# Patient Record
Sex: Male | Born: 1965 | Race: White | Hispanic: No | Marital: Married | Smoking: Never smoker
Health system: Southern US, Community
[De-identification: ages and names within clinical notes are randomized; demographics above are authoritative.]

## PROBLEM LIST (undated history)

## (undated) DIAGNOSIS — E785 Hyperlipidemia, unspecified: Secondary | ICD-10-CM

## (undated) DIAGNOSIS — T7840XA Allergy, unspecified, initial encounter: Secondary | ICD-10-CM

## (undated) DIAGNOSIS — K219 Gastro-esophageal reflux disease without esophagitis: Secondary | ICD-10-CM

## (undated) HISTORY — DX: Hyperlipidemia, unspecified: E78.5

## (undated) HISTORY — DX: Gastro-esophageal reflux disease without esophagitis: K21.9

## (undated) HISTORY — DX: Allergy, unspecified, initial encounter: T78.40XA

---
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
## 2000-12-24 ENCOUNTER — Encounter: Payer: Self-pay | Admitting: Family Medicine

## 2000-12-24 ENCOUNTER — Encounter: Admission: RE | Admit: 2000-12-24 | Discharge: 2000-12-24 | Payer: Self-pay | Admitting: Family Medicine

## 2004-12-28 ENCOUNTER — Emergency Department (HOSPITAL_COMMUNITY): Admission: EM | Admit: 2004-12-28 | Discharge: 2004-12-28 | Payer: Self-pay | Admitting: Emergency Medicine

## 2007-06-27 ENCOUNTER — Encounter: Admission: RE | Admit: 2007-06-27 | Discharge: 2007-06-27 | Payer: Self-pay | Admitting: Internal Medicine

## 2008-03-11 ENCOUNTER — Emergency Department (HOSPITAL_BASED_OUTPATIENT_CLINIC_OR_DEPARTMENT_OTHER): Admission: EM | Admit: 2008-03-11 | Discharge: 2008-03-11 | Payer: Self-pay | Admitting: Emergency Medicine

## 2008-07-21 ENCOUNTER — Encounter: Admission: RE | Admit: 2008-07-21 | Discharge: 2008-07-21 | Payer: Self-pay | Admitting: Internal Medicine

## 2008-08-21 ENCOUNTER — Encounter: Admission: RE | Admit: 2008-08-21 | Discharge: 2008-08-21 | Payer: Self-pay | Admitting: Neurosurgery

## 2011-02-24 LAB — DIFFERENTIAL
Basophils Absolute: 0
Basophils Relative: 1
Eosinophils Absolute: 0.1
Eosinophils Relative: 1
Monocytes Absolute: 0.4
Monocytes Relative: 7
Neutro Abs: 4.1

## 2011-02-24 LAB — CBC
HCT: 45.7
Hemoglobin: 15.5
MCHC: 34
MCV: 93.4
RDW: 12.2

## 2011-02-24 LAB — BASIC METABOLIC PANEL
CO2: 29
Calcium: 10
Chloride: 103
Glucose, Bld: 98
Sodium: 143

## 2011-02-24 LAB — POCT CARDIAC MARKERS: Troponin i, poc: <0.05

## 2014-06-28 ENCOUNTER — Ambulatory Visit (AMBULATORY_SURGERY_CENTER): Payer: Self-pay | Admitting: *Deleted

## 2014-06-28 VITALS — Ht 75.0 in | Wt 260.8 lb

## 2014-06-28 DIAGNOSIS — Z1211 Encounter for screening for malignant neoplasm of colon: Secondary | ICD-10-CM

## 2014-06-28 MED ORDER — MOVIPREP 100 G PO SOLR: 1.0000 | Freq: Once | ORAL | Status: DC

## 2014-06-28 NOTE — Progress Notes (Signed)
No home 02 use No diet pills No egg or soy allergy No issues with past sedation but was hard to wake with arthroscopy with shoulder  emmi video to e mail

## 2014-07-10 ENCOUNTER — Encounter: Payer: Self-pay | Admitting: Gastroenterology

## 2014-07-10 ENCOUNTER — Ambulatory Visit (AMBULATORY_SURGERY_CENTER): Payer: BLUE CROSS/BLUE SHIELD | Admitting: Gastroenterology

## 2014-07-10 VITALS — BP 119/73 | HR 57 | Temp 96.5°F | Resp 15 | Ht 75.0 in | Wt 260.0 lb

## 2014-07-10 DIAGNOSIS — Z1211 Encounter for screening for malignant neoplasm of colon: Secondary | ICD-10-CM

## 2014-07-10 MED ORDER — SODIUM CHLORIDE 0.9 % IV SOLN: 500.0000 mL | INTRAVENOUS | Status: DC

## 2014-07-10 NOTE — Progress Notes (Signed)
  [facility] Anesthesia Post-op Note  Patient: Karl DropRobert T Claassen  Procedure(s) Performed: colonoscopy  Patient Location: [facility] - Recovery Area  Anesthesia Type: Deep Sedation/Propofol  Level of Consciousness: awake, oriented and patient cooperative  Airway and Oxygen Therapy: Patient Spontanous Breathing  Post-op Pain: none  Post-op Assessment:  Post-op Vital signs reviewed, Patient's Cardiovascular Status Stable, Respiratory Function Stable, Patent Airway, No signs of Nausea or vomiting and Pain level controlled  Post-op Vital Signs: Reviewed and stable  Complications: No apparent anesthesia complications  Thora Scherman E 9:45 AM

## 2014-07-10 NOTE — Patient Instructions (Signed)

## 2014-07-10 NOTE — Op Note (Signed)
[facility] [address] [place], [id number]   COLONOSCOPY PROCEDURE REPORT  PATIENT: Karl Mccullough, Karl Mccullough  MR#: [id number] BIRTHDATE: [birth year] , 48  yrs. old GENDER: male ENDOSCOPIST: Meryl DareMalcolm Mccullough Griffon Herberg, MD, [facility] REFERRED ZH:[id number]:Russo, John PROCEDURE DATE:  07/10/2014 PROCEDURE:   Colonoscopy, screening First Screening Colonoscopy - Avg.  risk and is 50 yrs.  old or older Yes.  Prior Negative Screening - Now for repeat screening. N/A  History of Adenoma - Now for follow-up colonoscopy & has been > or = to 3 yrs.  N/A  Polyps Removed Today? No.  Polyps Removed Today? No.  Recommend repeat exam, <10 yrs? Polyps Removed Today? No.  Recommend repeat exam, <10 yrs? No. ASA CLASS:   Class II INDICATIONS:average risk patient for colorectal cancer. MEDICATIONS: Monitored anesthesia care and Propofol 200 mg IV DESCRIPTION OF PROCEDURE:   After the risks benefits and alternatives of the procedure were thoroughly explained, informed consent was obtained.  The digital rectal exam revealed no abnormalities of the rectum.   The     endoscope was introduced through the anus and advanced to the cecum, which was identified by both the appendix and ileocecal valve. No adverse events experienced.   The quality of the prep was excellent, using MoviPrep  The instrument was then slowly withdrawn as the colon was fully examined.    COLON FINDINGS: There was mild diverticulosis noted in the sigmoid colon and transverse colon.   The examination was otherwise normal. Retroflexed views revealed internal Grade I hemorrhoids. The time to cecum=2 minutes 32 seconds.  Withdrawal time=9 minutes 56 seconds.  The scope was withdrawn and the procedure completed. COMPLICATIONS: There were no immediate complications.  ENDOSCOPIC IMPRESSION: 1.   Mild diverticulosis in the sigmoid colon and transverse colon 2.   Grade l internal hemorrhoids  RECOMMENDATIONS: 1.  High fiber diet with liberal fluid  intake. 2.  You should continue to follow colorectal cancer screening guidelines for "routine risk" patients with a repeat colonoscopy in 10 years.  There is no need for routine, screening FOBT (stool) testing for at least 5 years.  eSigned:  Meryl DareMalcolm Mccullough Lason Eveland, MD, [facility] 07/10/2014 9:46 AM

## 2014-07-11 ENCOUNTER — Telehealth: Payer: Self-pay | Admitting: *Deleted

## 2014-07-11 NOTE — Telephone Encounter (Signed)
  Follow up Call-  Call back number 07/10/2014  Post procedure Call Back phone  # [phone]  Permission to leave phone message Yes     Patient questions:  Do you have a fever, pain , or abdominal swelling? No. Pain Score  0 *  Have you tolerated food without any problems? Yes.    Have you been able to return to your normal activities? Yes.    Do you have any questions about your discharge instructions: Diet   No. Medications  No. Follow up visit  No.  Do you have questions or concerns about your Care? No.  Actions: * If pain score is 4 or above: No action needed, pain <4.

## 2015-09-06 DIAGNOSIS — H40002 Preglaucoma, unspecified, left eye: Secondary | ICD-10-CM | POA: Diagnosis not present

## 2015-09-06 DIAGNOSIS — H21232 Degeneration of iris (pigmentary), left eye: Secondary | ICD-10-CM | POA: Diagnosis not present

## 2015-09-12 DIAGNOSIS — M9903 Segmental and somatic dysfunction of lumbar region: Secondary | ICD-10-CM | POA: Diagnosis not present

## 2015-09-12 DIAGNOSIS — M5417 Radiculopathy, lumbosacral region: Secondary | ICD-10-CM | POA: Diagnosis not present

## 2015-09-12 DIAGNOSIS — M9902 Segmental and somatic dysfunction of thoracic region: Secondary | ICD-10-CM | POA: Diagnosis not present

## 2015-09-12 DIAGNOSIS — M546 Pain in thoracic spine: Secondary | ICD-10-CM | POA: Diagnosis not present

## 2015-12-06 DIAGNOSIS — E784 Other hyperlipidemia: Secondary | ICD-10-CM | POA: Diagnosis not present

## 2015-12-16 DIAGNOSIS — E669 Obesity, unspecified: Secondary | ICD-10-CM | POA: Diagnosis not present

## 2015-12-16 DIAGNOSIS — M549 Dorsalgia, unspecified: Secondary | ICD-10-CM | POA: Diagnosis not present

## 2015-12-16 DIAGNOSIS — J019 Acute sinusitis, unspecified: Secondary | ICD-10-CM | POA: Diagnosis not present

## 2015-12-16 DIAGNOSIS — E784 Other hyperlipidemia: Secondary | ICD-10-CM | POA: Diagnosis not present

## 2016-03-05 DIAGNOSIS — Z23 Encounter for immunization: Secondary | ICD-10-CM | POA: Diagnosis not present

## 2016-03-10 DIAGNOSIS — M25512 Pain in left shoulder: Secondary | ICD-10-CM | POA: Diagnosis not present

## 2016-03-18 DIAGNOSIS — M7542 Impingement syndrome of left shoulder: Secondary | ICD-10-CM | POA: Diagnosis not present

## 2016-04-15 DIAGNOSIS — M7542 Impingement syndrome of left shoulder: Secondary | ICD-10-CM | POA: Diagnosis not present

## 2016-04-25 DIAGNOSIS — M7542 Impingement syndrome of left shoulder: Secondary | ICD-10-CM | POA: Diagnosis not present

## 2016-05-04 DIAGNOSIS — M7542 Impingement syndrome of left shoulder: Secondary | ICD-10-CM | POA: Diagnosis not present

## 2016-05-04 DIAGNOSIS — M7502 Adhesive capsulitis of left shoulder: Secondary | ICD-10-CM | POA: Diagnosis not present

## 2016-05-26 DIAGNOSIS — M7542 Impingement syndrome of left shoulder: Secondary | ICD-10-CM | POA: Diagnosis not present

## 2016-05-26 DIAGNOSIS — R600 Localized edema: Secondary | ICD-10-CM | POA: Diagnosis not present

## 2016-05-26 DIAGNOSIS — M7502 Adhesive capsulitis of left shoulder: Secondary | ICD-10-CM | POA: Diagnosis not present

## 2016-05-26 DIAGNOSIS — M75112 Incomplete rotator cuff tear or rupture of left shoulder, not specified as traumatic: Secondary | ICD-10-CM | POA: Diagnosis not present

## 2016-05-26 DIAGNOSIS — S43432A Superior glenoid labrum lesion of left shoulder, initial encounter: Secondary | ICD-10-CM | POA: Diagnosis not present

## 2016-05-26 DIAGNOSIS — G8918 Other acute postprocedural pain: Secondary | ICD-10-CM | POA: Diagnosis not present

## 2016-05-26 DIAGNOSIS — M24112 Other articular cartilage disorders, left shoulder: Secondary | ICD-10-CM | POA: Diagnosis not present

## 2016-05-26 DIAGNOSIS — M19012 Primary osteoarthritis, left shoulder: Secondary | ICD-10-CM | POA: Diagnosis not present

## 2016-05-26 DIAGNOSIS — M659 Synovitis and tenosynovitis, unspecified: Secondary | ICD-10-CM | POA: Diagnosis not present

## 2016-05-27 DIAGNOSIS — M25512 Pain in left shoulder: Secondary | ICD-10-CM | POA: Diagnosis not present

## 2016-05-29 DIAGNOSIS — M25512 Pain in left shoulder: Secondary | ICD-10-CM | POA: Diagnosis not present

## 2016-06-01 DIAGNOSIS — M25512 Pain in left shoulder: Secondary | ICD-10-CM | POA: Diagnosis not present

## 2016-06-03 DIAGNOSIS — M25512 Pain in left shoulder: Secondary | ICD-10-CM | POA: Diagnosis not present

## 2016-06-05 DIAGNOSIS — M25512 Pain in left shoulder: Secondary | ICD-10-CM | POA: Diagnosis not present

## 2016-06-08 DIAGNOSIS — M25512 Pain in left shoulder: Secondary | ICD-10-CM | POA: Diagnosis not present

## 2016-06-08 DIAGNOSIS — Z0000 Encounter for general adult medical examination without abnormal findings: Secondary | ICD-10-CM | POA: Diagnosis not present

## 2016-06-09 DIAGNOSIS — J0100 Acute maxillary sinusitis, unspecified: Secondary | ICD-10-CM | POA: Diagnosis not present

## 2016-06-12 DIAGNOSIS — M25512 Pain in left shoulder: Secondary | ICD-10-CM | POA: Diagnosis not present

## 2016-06-15 DIAGNOSIS — M25512 Pain in left shoulder: Secondary | ICD-10-CM | POA: Diagnosis not present

## 2016-06-15 DIAGNOSIS — Z125 Encounter for screening for malignant neoplasm of prostate: Secondary | ICD-10-CM | POA: Diagnosis not present

## 2016-06-15 DIAGNOSIS — J302 Other seasonal allergic rhinitis: Secondary | ICD-10-CM | POA: Diagnosis not present

## 2016-06-15 DIAGNOSIS — Z1389 Encounter for screening for other disorder: Secondary | ICD-10-CM | POA: Diagnosis not present

## 2016-06-15 DIAGNOSIS — Z0000 Encounter for general adult medical examination without abnormal findings: Secondary | ICD-10-CM | POA: Diagnosis not present

## 2016-06-15 DIAGNOSIS — K219 Gastro-esophageal reflux disease without esophagitis: Secondary | ICD-10-CM | POA: Diagnosis not present

## 2016-06-15 DIAGNOSIS — E668 Other obesity: Secondary | ICD-10-CM | POA: Diagnosis not present

## 2016-06-22 DIAGNOSIS — M25512 Pain in left shoulder: Secondary | ICD-10-CM | POA: Diagnosis not present

## 2016-06-26 DIAGNOSIS — M25512 Pain in left shoulder: Secondary | ICD-10-CM | POA: Diagnosis not present

## 2016-06-29 DIAGNOSIS — M25512 Pain in left shoulder: Secondary | ICD-10-CM | POA: Diagnosis not present

## 2016-07-03 DIAGNOSIS — M25512 Pain in left shoulder: Secondary | ICD-10-CM | POA: Diagnosis not present

## 2016-07-14 DIAGNOSIS — Z1212 Encounter for screening for malignant neoplasm of rectum: Secondary | ICD-10-CM | POA: Diagnosis not present

## 2016-07-16 DIAGNOSIS — R69 Illness, unspecified: Secondary | ICD-10-CM | POA: Diagnosis not present

## 2016-07-16 DIAGNOSIS — R52 Pain, unspecified: Secondary | ICD-10-CM | POA: Diagnosis not present

## 2016-11-17 DIAGNOSIS — M545 Low back pain: Secondary | ICD-10-CM | POA: Diagnosis not present

## 2016-11-17 DIAGNOSIS — M542 Cervicalgia: Secondary | ICD-10-CM | POA: Diagnosis not present

## 2016-11-17 DIAGNOSIS — M9903 Segmental and somatic dysfunction of lumbar region: Secondary | ICD-10-CM | POA: Diagnosis not present

## 2016-11-17 DIAGNOSIS — M9901 Segmental and somatic dysfunction of cervical region: Secondary | ICD-10-CM | POA: Diagnosis not present

## 2016-12-25 DIAGNOSIS — R030 Elevated blood-pressure reading, without diagnosis of hypertension: Secondary | ICD-10-CM | POA: Diagnosis not present

## 2016-12-25 DIAGNOSIS — E668 Other obesity: Secondary | ICD-10-CM | POA: Diagnosis not present

## 2016-12-25 DIAGNOSIS — M25512 Pain in left shoulder: Secondary | ICD-10-CM | POA: Diagnosis not present

## 2016-12-25 DIAGNOSIS — E784 Other hyperlipidemia: Secondary | ICD-10-CM | POA: Diagnosis not present

## 2017-03-06 DIAGNOSIS — Z23 Encounter for immunization: Secondary | ICD-10-CM | POA: Diagnosis not present

## 2017-05-10 DIAGNOSIS — J040 Acute laryngitis: Secondary | ICD-10-CM | POA: Diagnosis not present

## 2017-05-10 DIAGNOSIS — J019 Acute sinusitis, unspecified: Secondary | ICD-10-CM | POA: Diagnosis not present

## 2017-06-11 DIAGNOSIS — R82998 Other abnormal findings in urine: Secondary | ICD-10-CM | POA: Diagnosis not present

## 2017-06-11 DIAGNOSIS — Z125 Encounter for screening for malignant neoplasm of prostate: Secondary | ICD-10-CM | POA: Diagnosis not present

## 2017-06-11 DIAGNOSIS — Z0000 Encounter for general adult medical examination without abnormal findings: Secondary | ICD-10-CM | POA: Diagnosis not present

## 2017-06-11 DIAGNOSIS — E7849 Other hyperlipidemia: Secondary | ICD-10-CM | POA: Diagnosis not present

## 2017-06-18 DIAGNOSIS — R0683 Snoring: Secondary | ICD-10-CM | POA: Diagnosis not present

## 2017-06-18 DIAGNOSIS — R030 Elevated blood-pressure reading, without diagnosis of hypertension: Secondary | ICD-10-CM | POA: Diagnosis not present

## 2017-06-18 DIAGNOSIS — E668 Other obesity: Secondary | ICD-10-CM | POA: Diagnosis not present

## 2017-06-18 DIAGNOSIS — Z1389 Encounter for screening for other disorder: Secondary | ICD-10-CM | POA: Diagnosis not present

## 2017-06-18 DIAGNOSIS — M25512 Pain in left shoulder: Secondary | ICD-10-CM | POA: Diagnosis not present

## 2017-06-18 DIAGNOSIS — Z0000 Encounter for general adult medical examination without abnormal findings: Secondary | ICD-10-CM | POA: Diagnosis not present

## 2017-06-21 ENCOUNTER — Other Ambulatory Visit: Payer: Self-pay | Admitting: Internal Medicine

## 2017-06-21 DIAGNOSIS — E785 Hyperlipidemia, unspecified: Secondary | ICD-10-CM

## 2017-06-22 DIAGNOSIS — Z1212 Encounter for screening for malignant neoplasm of rectum: Secondary | ICD-10-CM | POA: Diagnosis not present

## 2017-06-29 ENCOUNTER — Ambulatory Visit
Admission: RE | Admit: 2017-06-29 | Discharge: 2017-06-29 | Disposition: A | Discharge status: Home / Self Care | Payer: BLUE CROSS/BLUE SHIELD | Source: Ambulatory Visit | Attending: Internal Medicine | Admitting: Internal Medicine

## 2017-06-29 DIAGNOSIS — E785 Hyperlipidemia, unspecified: Secondary | ICD-10-CM

## 2017-12-21 DIAGNOSIS — E7849 Other hyperlipidemia: Secondary | ICD-10-CM | POA: Diagnosis not present

## 2018-02-24 DIAGNOSIS — Z23 Encounter for immunization: Secondary | ICD-10-CM | POA: Diagnosis not present

## 2018-06-02 IMAGING — CT CT HEART SCORING
3 series · 14 of 20 positions shown, 16 images · non-contrast
Comparison: None.

CLINICAL DATA: High cholesterol

EXAM:
CT HEART FOR CALCIUM SCORING
TECHNIQUE: CT heart was performed on a 64 channel system using prospective ECG
gating.
A non-contrast exam for calcium scoring was performed.
Note that this exam targets the heart and the chest was not imaged
in its entirety.

[Series 2: smartscore - gated 0.4 sec · axial · 0.49mm/px · z∈[-252,-162]mm · 3 of 72 slices shown]
[im 18/72  vessel]
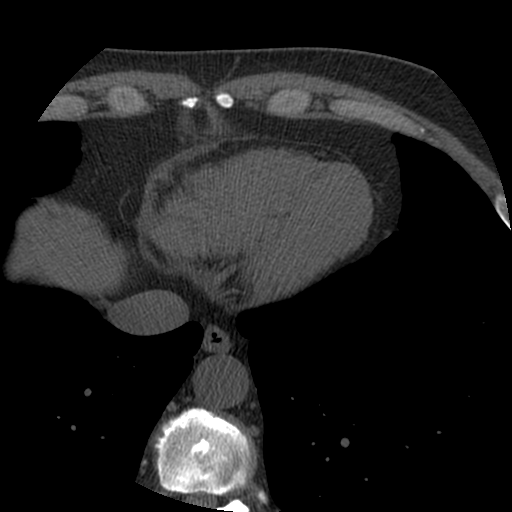
[im 36/72  vessel]
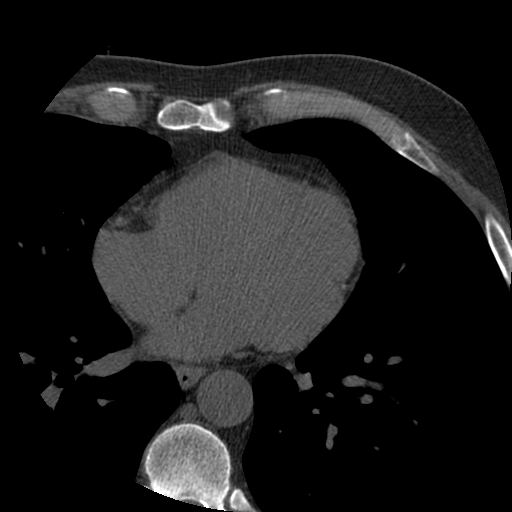
[im 54/72  vessel]
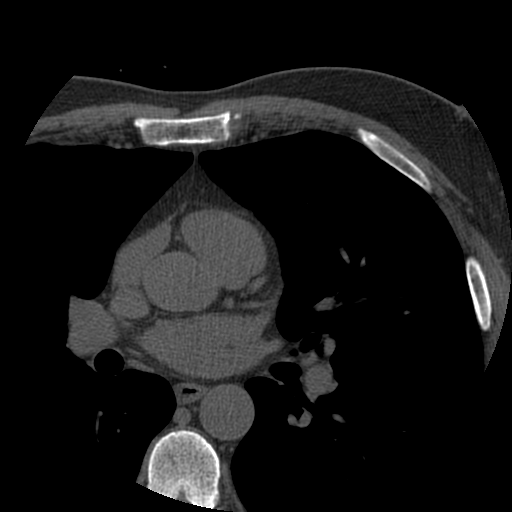

[Series 3: standard 5mm · axial · 0.70mm/px · z∈[-259,-154]mm · 4 of 72 slices shown]
[im 15/72  vessel]
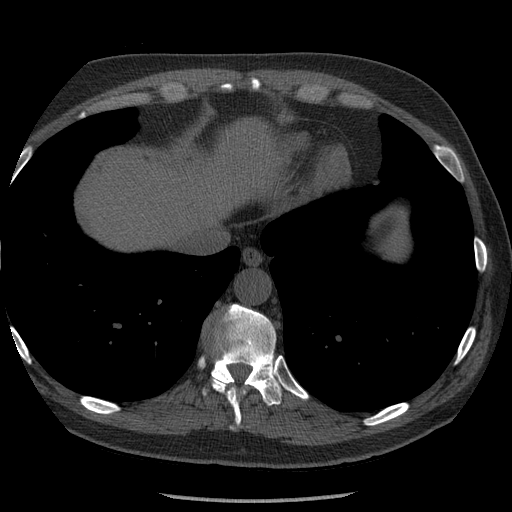
[im 29/72  vessel]
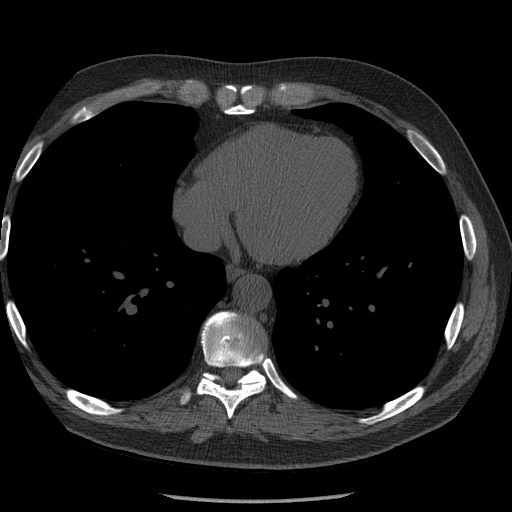
[im 43/72  vessel]
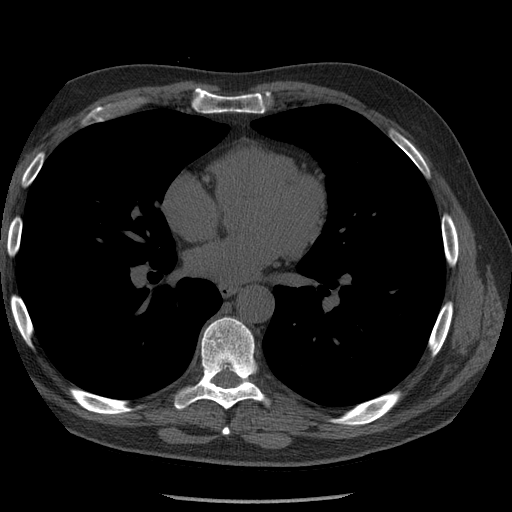
[im 57/72  vessel]
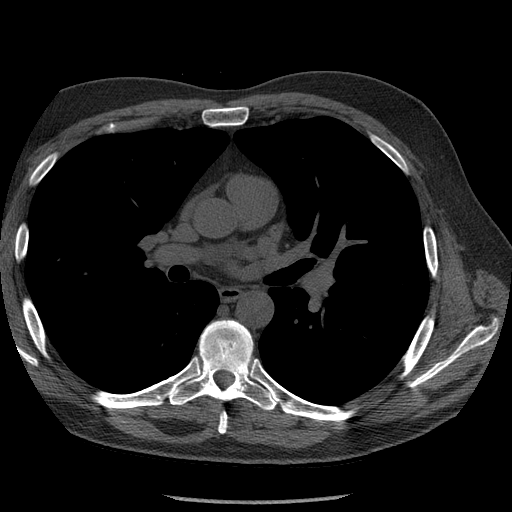

[Series 501: sagittal · sagittal · 0.70mm/px · 7 of 117 slices shown, 9 images]
[im 15/117  vessel]
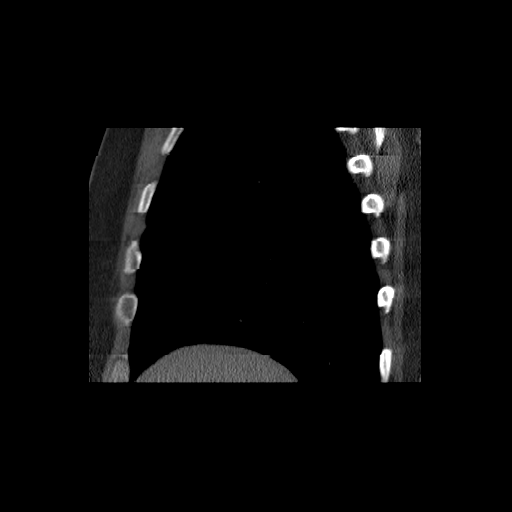
[im 15/117  lung]
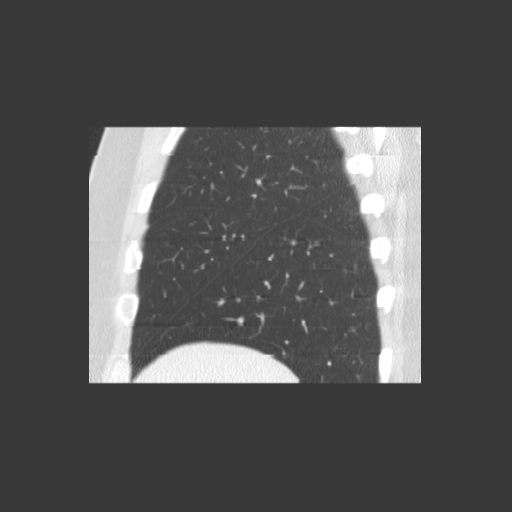
[im 30/117  vessel]
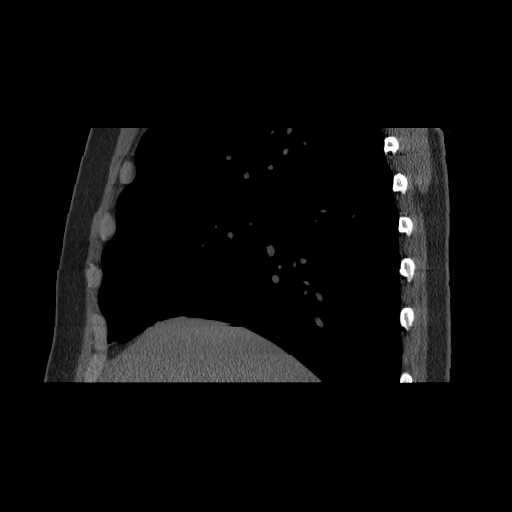
[im 44/117  vessel]
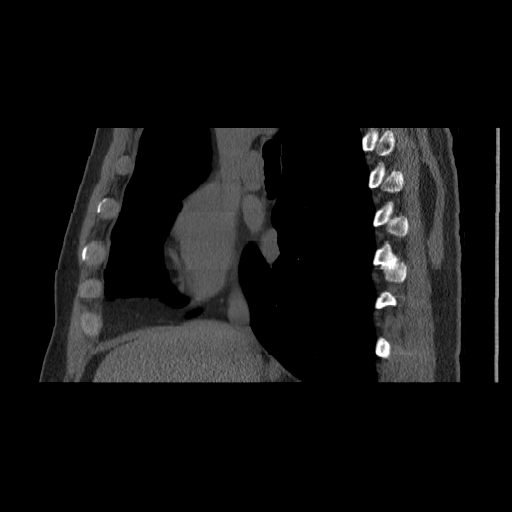
[im 59/117  vessel]
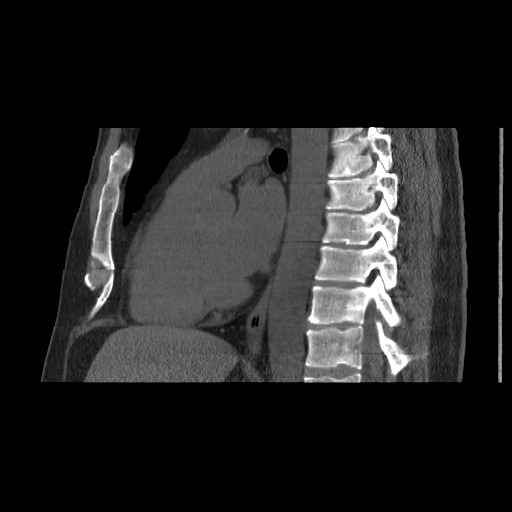
[im 73/117  vessel]
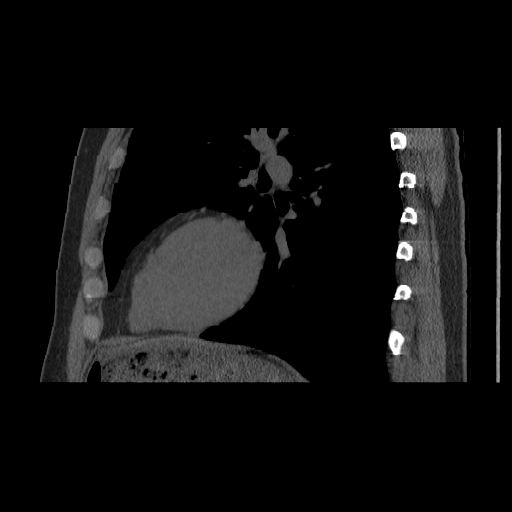
[im 73/117  lung]
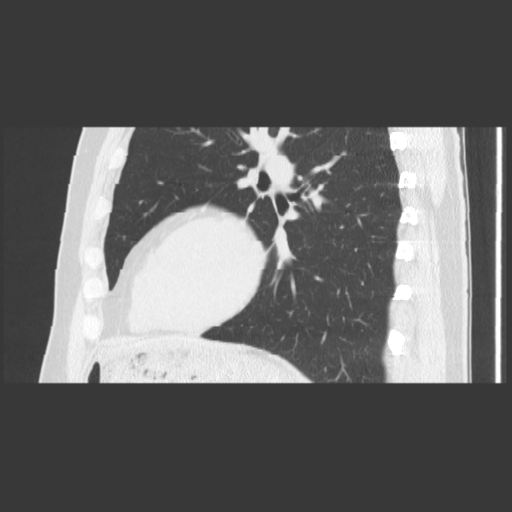
[im 88/117  vessel]
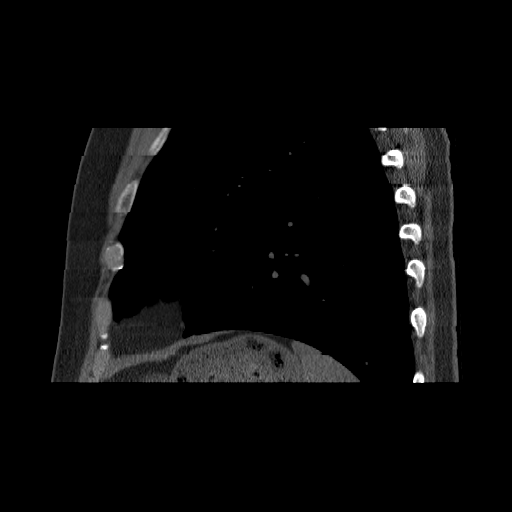
[im 102/117  vessel]
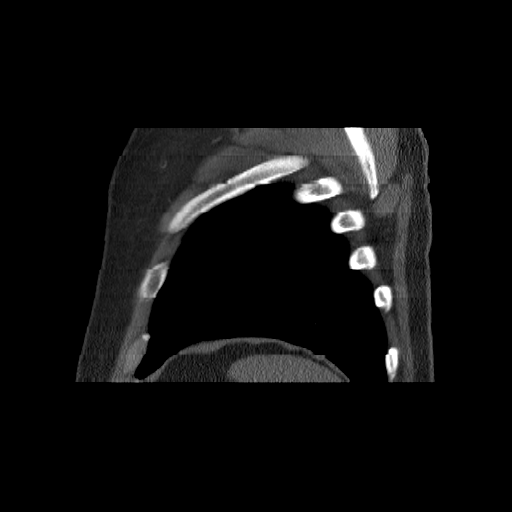

[14 of 20 positions shown; findings below may reference images not displayed]

FINDINGS: Technical quality: Good.

CORONARY CALCIUM

Total Agatston Score: 0

[HOSPITAL] percentile:  0

OTHER FINDINGS:

Cardiovascular: Heart is normal size. Visualized aorta is normal
caliber.

Mediastinum/Nodes: No adenopathy in the lower mediastinum or hila.

Lungs/Pleura: Small nodule in the left lower lobe on image 43, 4 mm.
3 mm nodule in the right upper lobe on image 6. 3 mm nodule in the
right middle lobe on image 27. No effusions.

Upper Abdomen: Imaging into the upper abdomen shows no acute
findings.

Musculoskeletal: Chest wall soft tissues are unremarkable. No acute
bony abnormality.
IMPRESSION: No visible coronary artery calcifications. Total coronary calcium
score of 0.

Small bilateral pulmonary nodules, 4 mm or smaller. No follow-up
needed if patient is low-risk (and has no known or suspected primary
neoplasm). Non-contrast chest CT can be considered in 12 months if
patient is high-risk. This recommendation follows the consensus
statement: Guidelines for Management of Incidental Pulmonary Nodules
Detected on CT Images: From the [HOSPITAL] 0821; Radiology

## 2018-06-16 DIAGNOSIS — R509 Fever, unspecified: Secondary | ICD-10-CM | POA: Diagnosis not present

## 2018-06-21 DIAGNOSIS — Z0000 Encounter for general adult medical examination without abnormal findings: Secondary | ICD-10-CM | POA: Diagnosis not present

## 2018-06-21 DIAGNOSIS — R82998 Other abnormal findings in urine: Secondary | ICD-10-CM | POA: Diagnosis not present

## 2018-06-21 DIAGNOSIS — E7849 Other hyperlipidemia: Secondary | ICD-10-CM | POA: Diagnosis not present

## 2018-06-21 DIAGNOSIS — Z125 Encounter for screening for malignant neoplasm of prostate: Secondary | ICD-10-CM | POA: Diagnosis not present

## 2018-06-24 DIAGNOSIS — E7849 Other hyperlipidemia: Secondary | ICD-10-CM | POA: Diagnosis not present

## 2018-06-24 DIAGNOSIS — Z1339 Encounter for screening examination for other mental health and behavioral disorders: Secondary | ICD-10-CM | POA: Diagnosis not present

## 2018-06-24 DIAGNOSIS — Z1331 Encounter for screening for depression: Secondary | ICD-10-CM | POA: Diagnosis not present

## 2018-06-24 DIAGNOSIS — Z0000 Encounter for general adult medical examination without abnormal findings: Secondary | ICD-10-CM | POA: Diagnosis not present

## 2018-06-24 DIAGNOSIS — Z23 Encounter for immunization: Secondary | ICD-10-CM | POA: Diagnosis not present

## 2018-06-24 DIAGNOSIS — R0683 Snoring: Secondary | ICD-10-CM | POA: Diagnosis not present

## 2018-06-24 DIAGNOSIS — R911 Solitary pulmonary nodule: Secondary | ICD-10-CM | POA: Diagnosis not present

## 2018-06-24 DIAGNOSIS — Z8249 Family history of ischemic heart disease and other diseases of the circulatory system: Secondary | ICD-10-CM | POA: Diagnosis not present

## 2018-06-27 DIAGNOSIS — Z1212 Encounter for screening for malignant neoplasm of rectum: Secondary | ICD-10-CM | POA: Diagnosis not present

## 2018-06-29 ENCOUNTER — Other Ambulatory Visit: Payer: Self-pay | Admitting: Internal Medicine

## 2018-06-29 DIAGNOSIS — R911 Solitary pulmonary nodule: Secondary | ICD-10-CM

## 2018-07-04 ENCOUNTER — Ambulatory Visit
Admission: RE | Admit: 2018-07-04 | Discharge: 2018-07-04 | Disposition: A | Discharge status: Home / Self Care | Payer: BLUE CROSS/BLUE SHIELD | Source: Ambulatory Visit | Attending: Internal Medicine | Admitting: Internal Medicine

## 2018-07-04 DIAGNOSIS — R911 Solitary pulmonary nodule: Secondary | ICD-10-CM

## 2018-07-04 DIAGNOSIS — R918 Other nonspecific abnormal finding of lung field: Secondary | ICD-10-CM | POA: Diagnosis not present

## 2018-09-28 DIAGNOSIS — M25562 Pain in left knee: Secondary | ICD-10-CM | POA: Diagnosis not present

## 2019-02-28 DIAGNOSIS — Z23 Encounter for immunization: Secondary | ICD-10-CM | POA: Diagnosis not present

## 2019-03-24 DIAGNOSIS — M545 Low back pain: Secondary | ICD-10-CM | POA: Diagnosis not present

## 2019-03-24 DIAGNOSIS — M9902 Segmental and somatic dysfunction of thoracic region: Secondary | ICD-10-CM | POA: Diagnosis not present

## 2019-03-24 DIAGNOSIS — M9903 Segmental and somatic dysfunction of lumbar region: Secondary | ICD-10-CM | POA: Diagnosis not present

## 2019-03-24 DIAGNOSIS — M5413 Radiculopathy, cervicothoracic region: Secondary | ICD-10-CM | POA: Diagnosis not present

## 2019-05-12 DIAGNOSIS — M5413 Radiculopathy, cervicothoracic region: Secondary | ICD-10-CM | POA: Diagnosis not present

## 2019-05-12 DIAGNOSIS — M545 Low back pain: Secondary | ICD-10-CM | POA: Diagnosis not present

## 2019-05-12 DIAGNOSIS — M9903 Segmental and somatic dysfunction of lumbar region: Secondary | ICD-10-CM | POA: Diagnosis not present

## 2019-05-12 DIAGNOSIS — M9902 Segmental and somatic dysfunction of thoracic region: Secondary | ICD-10-CM | POA: Diagnosis not present

## 2019-05-29 DIAGNOSIS — H903 Sensorineural hearing loss, bilateral: Secondary | ICD-10-CM | POA: Diagnosis not present

## 2019-05-29 DIAGNOSIS — H9113 Presbycusis, bilateral: Secondary | ICD-10-CM | POA: Diagnosis not present

## 2019-06-13 DIAGNOSIS — H903 Sensorineural hearing loss, bilateral: Secondary | ICD-10-CM | POA: Diagnosis not present

## 2019-06-13 DIAGNOSIS — H9113 Presbycusis, bilateral: Secondary | ICD-10-CM | POA: Diagnosis not present

## 2019-06-30 DIAGNOSIS — R82998 Other abnormal findings in urine: Secondary | ICD-10-CM | POA: Diagnosis not present

## 2019-06-30 DIAGNOSIS — Z0000 Encounter for general adult medical examination without abnormal findings: Secondary | ICD-10-CM | POA: Diagnosis not present

## 2019-06-30 DIAGNOSIS — Z125 Encounter for screening for malignant neoplasm of prostate: Secondary | ICD-10-CM | POA: Diagnosis not present

## 2019-06-30 DIAGNOSIS — E7849 Other hyperlipidemia: Secondary | ICD-10-CM | POA: Diagnosis not present

## 2019-07-07 DIAGNOSIS — R972 Elevated prostate specific antigen [PSA]: Secondary | ICD-10-CM | POA: Diagnosis not present

## 2019-07-07 DIAGNOSIS — Z8249 Family history of ischemic heart disease and other diseases of the circulatory system: Secondary | ICD-10-CM | POA: Diagnosis not present

## 2019-07-07 DIAGNOSIS — Z1331 Encounter for screening for depression: Secondary | ICD-10-CM | POA: Diagnosis not present

## 2019-07-07 DIAGNOSIS — R0683 Snoring: Secondary | ICD-10-CM | POA: Diagnosis not present

## 2019-07-07 DIAGNOSIS — D72819 Decreased white blood cell count, unspecified: Secondary | ICD-10-CM | POA: Diagnosis not present

## 2019-07-07 DIAGNOSIS — Z0000 Encounter for general adult medical examination without abnormal findings: Secondary | ICD-10-CM | POA: Diagnosis not present

## 2019-07-10 DIAGNOSIS — Z1212 Encounter for screening for malignant neoplasm of rectum: Secondary | ICD-10-CM | POA: Diagnosis not present

## 2019-08-07 DIAGNOSIS — R972 Elevated prostate specific antigen [PSA]: Secondary | ICD-10-CM | POA: Diagnosis not present

## 2019-09-21 DIAGNOSIS — M5417 Radiculopathy, lumbosacral region: Secondary | ICD-10-CM | POA: Diagnosis not present

## 2019-09-21 DIAGNOSIS — M546 Pain in thoracic spine: Secondary | ICD-10-CM | POA: Diagnosis not present

## 2019-09-21 DIAGNOSIS — M9903 Segmental and somatic dysfunction of lumbar region: Secondary | ICD-10-CM | POA: Diagnosis not present

## 2019-09-21 DIAGNOSIS — M9902 Segmental and somatic dysfunction of thoracic region: Secondary | ICD-10-CM | POA: Diagnosis not present

## 2020-01-01 DIAGNOSIS — M546 Pain in thoracic spine: Secondary | ICD-10-CM | POA: Diagnosis not present

## 2020-01-01 DIAGNOSIS — M9902 Segmental and somatic dysfunction of thoracic region: Secondary | ICD-10-CM | POA: Diagnosis not present

## 2020-01-01 DIAGNOSIS — M9903 Segmental and somatic dysfunction of lumbar region: Secondary | ICD-10-CM | POA: Diagnosis not present

## 2020-01-01 DIAGNOSIS — M5417 Radiculopathy, lumbosacral region: Secondary | ICD-10-CM | POA: Diagnosis not present

## 2020-02-27 DIAGNOSIS — Z23 Encounter for immunization: Secondary | ICD-10-CM | POA: Diagnosis not present

## 2020-06-13 DIAGNOSIS — Z1152 Encounter for screening for COVID-19: Secondary | ICD-10-CM | POA: Diagnosis not present

## 2020-08-30 DIAGNOSIS — Z125 Encounter for screening for malignant neoplasm of prostate: Secondary | ICD-10-CM | POA: Diagnosis not present

## 2020-08-30 DIAGNOSIS — R82998 Other abnormal findings in urine: Secondary | ICD-10-CM | POA: Diagnosis not present

## 2020-08-30 DIAGNOSIS — E785 Hyperlipidemia, unspecified: Secondary | ICD-10-CM | POA: Diagnosis not present

## 2020-09-05 DIAGNOSIS — Z1212 Encounter for screening for malignant neoplasm of rectum: Secondary | ICD-10-CM | POA: Diagnosis not present

## 2020-09-05 DIAGNOSIS — E785 Hyperlipidemia, unspecified: Secondary | ICD-10-CM | POA: Diagnosis not present

## 2020-09-05 DIAGNOSIS — Z0000 Encounter for general adult medical examination without abnormal findings: Secondary | ICD-10-CM | POA: Diagnosis not present

## 2021-02-26 DIAGNOSIS — Z23 Encounter for immunization: Secondary | ICD-10-CM | POA: Diagnosis not present

## 2021-03-08 DIAGNOSIS — Z20822 Contact with and (suspected) exposure to covid-19: Secondary | ICD-10-CM | POA: Diagnosis not present

## 2021-06-06 DIAGNOSIS — M5417 Radiculopathy, lumbosacral region: Secondary | ICD-10-CM | POA: Diagnosis not present

## 2021-06-06 DIAGNOSIS — M546 Pain in thoracic spine: Secondary | ICD-10-CM | POA: Diagnosis not present

## 2021-06-06 DIAGNOSIS — M9903 Segmental and somatic dysfunction of lumbar region: Secondary | ICD-10-CM | POA: Diagnosis not present

## 2021-06-06 DIAGNOSIS — M9902 Segmental and somatic dysfunction of thoracic region: Secondary | ICD-10-CM | POA: Diagnosis not present

## 2021-07-14 DIAGNOSIS — M9902 Segmental and somatic dysfunction of thoracic region: Secondary | ICD-10-CM | POA: Diagnosis not present

## 2021-07-14 DIAGNOSIS — M546 Pain in thoracic spine: Secondary | ICD-10-CM | POA: Diagnosis not present

## 2021-07-14 DIAGNOSIS — M9903 Segmental and somatic dysfunction of lumbar region: Secondary | ICD-10-CM | POA: Diagnosis not present

## 2021-07-14 DIAGNOSIS — M5417 Radiculopathy, lumbosacral region: Secondary | ICD-10-CM | POA: Diagnosis not present

## 2021-07-18 DIAGNOSIS — M5417 Radiculopathy, lumbosacral region: Secondary | ICD-10-CM | POA: Diagnosis not present

## 2021-07-18 DIAGNOSIS — M546 Pain in thoracic spine: Secondary | ICD-10-CM | POA: Diagnosis not present

## 2021-07-18 DIAGNOSIS — M9903 Segmental and somatic dysfunction of lumbar region: Secondary | ICD-10-CM | POA: Diagnosis not present

## 2021-07-18 DIAGNOSIS — M9902 Segmental and somatic dysfunction of thoracic region: Secondary | ICD-10-CM | POA: Diagnosis not present

## 2021-07-23 DIAGNOSIS — M5417 Radiculopathy, lumbosacral region: Secondary | ICD-10-CM | POA: Diagnosis not present

## 2021-07-23 DIAGNOSIS — M9902 Segmental and somatic dysfunction of thoracic region: Secondary | ICD-10-CM | POA: Diagnosis not present

## 2021-07-23 DIAGNOSIS — M9903 Segmental and somatic dysfunction of lumbar region: Secondary | ICD-10-CM | POA: Diagnosis not present

## 2021-07-23 DIAGNOSIS — M546 Pain in thoracic spine: Secondary | ICD-10-CM | POA: Diagnosis not present

## 2021-08-21 DIAGNOSIS — M5417 Radiculopathy, lumbosacral region: Secondary | ICD-10-CM | POA: Diagnosis not present

## 2021-08-21 DIAGNOSIS — M9903 Segmental and somatic dysfunction of lumbar region: Secondary | ICD-10-CM | POA: Diagnosis not present

## 2021-08-21 DIAGNOSIS — M546 Pain in thoracic spine: Secondary | ICD-10-CM | POA: Diagnosis not present

## 2021-08-21 DIAGNOSIS — M9902 Segmental and somatic dysfunction of thoracic region: Secondary | ICD-10-CM | POA: Diagnosis not present

## 2021-09-22 DIAGNOSIS — E785 Hyperlipidemia, unspecified: Secondary | ICD-10-CM | POA: Diagnosis not present

## 2021-09-22 DIAGNOSIS — Z125 Encounter for screening for malignant neoplasm of prostate: Secondary | ICD-10-CM | POA: Diagnosis not present

## 2021-09-22 DIAGNOSIS — Z0000 Encounter for general adult medical examination without abnormal findings: Secondary | ICD-10-CM | POA: Diagnosis not present

## 2021-09-29 DIAGNOSIS — Z1331 Encounter for screening for depression: Secondary | ICD-10-CM | POA: Diagnosis not present

## 2021-09-29 DIAGNOSIS — Z0000 Encounter for general adult medical examination without abnormal findings: Secondary | ICD-10-CM | POA: Diagnosis not present

## 2021-09-29 DIAGNOSIS — Z1339 Encounter for screening examination for other mental health and behavioral disorders: Secondary | ICD-10-CM | POA: Diagnosis not present

## 2021-09-29 DIAGNOSIS — E785 Hyperlipidemia, unspecified: Secondary | ICD-10-CM | POA: Diagnosis not present

## 2022-02-09 DIAGNOSIS — M9903 Segmental and somatic dysfunction of lumbar region: Secondary | ICD-10-CM | POA: Diagnosis not present

## 2022-02-09 DIAGNOSIS — M5417 Radiculopathy, lumbosacral region: Secondary | ICD-10-CM | POA: Diagnosis not present

## 2022-02-09 DIAGNOSIS — M546 Pain in thoracic spine: Secondary | ICD-10-CM | POA: Diagnosis not present

## 2022-02-09 DIAGNOSIS — M9902 Segmental and somatic dysfunction of thoracic region: Secondary | ICD-10-CM | POA: Diagnosis not present

## 2022-10-15 ENCOUNTER — Other Ambulatory Visit (HOSPITAL_BASED_OUTPATIENT_CLINIC_OR_DEPARTMENT_OTHER): Payer: Self-pay | Admitting: Internal Medicine

## 2022-10-15 DIAGNOSIS — E785 Hyperlipidemia, unspecified: Secondary | ICD-10-CM

## 2022-10-16 ENCOUNTER — Telehealth (HOSPITAL_BASED_OUTPATIENT_CLINIC_OR_DEPARTMENT_OTHER): Payer: Self-pay

## 2022-11-06 ENCOUNTER — Ambulatory Visit (HOSPITAL_BASED_OUTPATIENT_CLINIC_OR_DEPARTMENT_OTHER)
Admission: RE | Admit: 2022-11-06 | Discharge: 2022-11-06 | Disposition: A | Discharge status: Home / Self Care | Payer: BLUE CROSS/BLUE SHIELD | Source: Ambulatory Visit | Attending: Internal Medicine | Admitting: Internal Medicine

## 2022-11-06 DIAGNOSIS — E785 Hyperlipidemia, unspecified: Secondary | ICD-10-CM

## 2023-02-24 DIAGNOSIS — M9903 Segmental and somatic dysfunction of lumbar region: Secondary | ICD-10-CM | POA: Diagnosis not present

## 2023-02-24 DIAGNOSIS — M5417 Radiculopathy, lumbosacral region: Secondary | ICD-10-CM | POA: Diagnosis not present

## 2023-02-24 DIAGNOSIS — M9904 Segmental and somatic dysfunction of sacral region: Secondary | ICD-10-CM | POA: Diagnosis not present

## 2023-02-24 DIAGNOSIS — M62830 Muscle spasm of back: Secondary | ICD-10-CM | POA: Diagnosis not present

## 2023-10-20 DIAGNOSIS — R972 Elevated prostate specific antigen [PSA]: Secondary | ICD-10-CM | POA: Diagnosis not present

## 2023-10-20 DIAGNOSIS — E785 Hyperlipidemia, unspecified: Secondary | ICD-10-CM | POA: Diagnosis not present

## 2023-10-22 DIAGNOSIS — Z1331 Encounter for screening for depression: Secondary | ICD-10-CM | POA: Diagnosis not present

## 2023-10-22 DIAGNOSIS — R972 Elevated prostate specific antigen [PSA]: Secondary | ICD-10-CM | POA: Diagnosis not present

## 2023-10-22 DIAGNOSIS — R82998 Other abnormal findings in urine: Secondary | ICD-10-CM | POA: Diagnosis not present

## 2023-10-22 DIAGNOSIS — Z1389 Encounter for screening for other disorder: Secondary | ICD-10-CM | POA: Diagnosis not present

## 2023-10-22 DIAGNOSIS — Z0000 Encounter for general adult medical examination without abnormal findings: Secondary | ICD-10-CM | POA: Diagnosis not present

## 2023-11-29 DIAGNOSIS — J069 Acute upper respiratory infection, unspecified: Secondary | ICD-10-CM | POA: Diagnosis not present
